# Patient Record
Sex: Male | Born: 2000 | Race: Black or African American | Hispanic: No | Marital: Single | Smoking: Never smoker
Health system: Southern US, Community
[De-identification: ages and names within clinical notes are randomized; demographics above are authoritative.]

---
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
## 2021-05-24 ENCOUNTER — Emergency Department (HOSPITAL_BASED_OUTPATIENT_CLINIC_OR_DEPARTMENT_OTHER): Payer: Medicaid Other

## 2021-05-24 ENCOUNTER — Encounter (HOSPITAL_BASED_OUTPATIENT_CLINIC_OR_DEPARTMENT_OTHER): Payer: Self-pay | Admitting: Emergency Medicine

## 2021-05-24 ENCOUNTER — Emergency Department (HOSPITAL_BASED_OUTPATIENT_CLINIC_OR_DEPARTMENT_OTHER)
Admission: EM | Admit: 2021-05-24 | Discharge: 2021-05-24 | Disposition: A | Discharge status: Home / Self Care | Payer: Medicaid Other | Attending: Emergency Medicine | Admitting: Emergency Medicine

## 2021-05-24 ENCOUNTER — Other Ambulatory Visit: Payer: Self-pay

## 2021-05-24 DIAGNOSIS — M79605 Pain in left leg: Secondary | ICD-10-CM | POA: Insufficient documentation

## 2021-05-24 DIAGNOSIS — R109 Unspecified abdominal pain: Secondary | ICD-10-CM | POA: Insufficient documentation

## 2021-05-24 LAB — URINALYSIS, ROUTINE W REFLEX MICROSCOPIC
Bilirubin Urine: NEGATIVE
Glucose, UA: NEGATIVE mg/dL
Hgb urine dipstick: NEGATIVE
Ketones, ur: NEGATIVE mg/dL
Leukocytes,Ua: NEGATIVE
Nitrite: NEGATIVE
Protein, ur: NEGATIVE mg/dL
Specific Gravity, Urine: 1.01 (ref 1.005–1.030)
pH: 7 (ref 5.0–8.0)

## 2021-05-24 NOTE — ED Provider Notes (Signed)
[facility] EMERGENCY DEPT Provider Note   CSN: [id number] Arrival date & time: 05/24/21  1632     History  Chief Complaint  Patient presents with   Flank Pain    Victor Kaufman is a 21 y.o. male who presents to the ED complaining of left leg pain x3 weeks.  He describes it as a dull ache and is constant.  He notes that his symptoms mildly resolved and then came back.  Denies history of UTI kidney stones.  Has not tried any medications for his symptoms.  Denies fever, chills, nausea, vomiting, abdominal pain, penile pain/swelling, testicular pain/swelling.  Patient says he is not sexually active.   The history is provided by the patient and a parent. No language interpreter was used.      Home Medications Prior to Admission medications   Medication Sig Start Date End Date Taking? Authorizing Provider  benzocaine (BABY ORAJEL) 7.5 % oral gel Use as directed in the mouth or throat 3 (three) times daily as needed for pain. Patient not taking: Reported on 06/29/2020 06/27/17   Ok Edwards, PA-C  chlorhexidine (PERIDEX) 0.12 % solution Use as directed 15 mLs in the mouth or throat 2 (two) times daily. Patient not taking: Reported on 06/29/2020 06/27/17   Ok Edwards, PA-C  meloxicam (MOBIC) 7.5 MG tablet Take 1 tablet (7.5 mg total) by mouth daily. Patient not taking: Reported on 06/29/2020 06/27/17   Ok Edwards, PA-C      Allergies    Patient has no known allergies.    Review of Systems   Review of Systems  Constitutional:  Negative for chills and fever.  Gastrointestinal:  Negative for abdominal pain, nausea and vomiting.  Genitourinary:  Positive for flank pain. Negative for dysuria, hematuria, penile discharge, penile pain, penile swelling, scrotal swelling and testicular pain.  All other systems reviewed and are negative.  Physical Exam Updated Vital Signs BP 124/76 (BP Location: Right Arm)    Pulse (!) 51    Temp 98.8 F (37.1 C) (Oral)    Resp 16    Ht 6' (1.829 m)     Wt 81.6 kg    SpO2 100%    BMI 24.41 kg/m  Physical Exam Vitals and nursing note reviewed.  Constitutional:      General: He is not in acute distress.    Appearance: He is not diaphoretic.  HENT:     Head: Normocephalic and atraumatic.     Mouth/Throat:     Pharynx: No oropharyngeal exudate.  Eyes:     General: No scleral icterus.    Conjunctiva/sclera: Conjunctivae normal.  Cardiovascular:     Rate and Rhythm: Normal rate and regular rhythm.     Pulses: Normal pulses.     Heart sounds: Normal heart sounds.  Pulmonary:     Effort: Pulmonary effort is normal. No respiratory distress.     Breath sounds: Normal breath sounds. No wheezing.  Abdominal:     General: Bowel sounds are normal.     Palpations: Abdomen is soft. There is no mass.     Tenderness: There is no abdominal tenderness. There is left CVA tenderness. There is no right CVA tenderness, guarding or rebound.     Comments: Mild left CVA tenderness to palpation.  No overlying skin changes.  No tenderness to palpation noted to abdomen.  Musculoskeletal:        General: Normal range of motion.     Cervical back: Normal range of motion  and neck supple.     Comments: No edema noted to bilateral lower extremities.  Skin:    General: Skin is warm and dry.  Neurological:     Mental Status: He is alert.  Psychiatric:        Behavior: Behavior normal.    ED Results / Procedures / Treatments   Labs (all labs ordered are listed, but only abnormal results are displayed) Labs Reviewed  URINALYSIS, ROUTINE W REFLEX MICROSCOPIC - Abnormal; Notable for the following components:      Result Value   Color, Urine COLORLESS (*)    All other components within normal limits    EKG None  Radiology CT Renal Stone Study  Result Date: 05/24/2021 CLINICAL DATA:  21 year old male with acute LEFT abdominal and flank pain for 3 weeks. EXAM: CT ABDOMEN AND PELVIS WITHOUT CONTRAST TECHNIQUE: Multidetector CT imaging of the abdomen and  pelvis was performed following the standard protocol without IV contrast. RADIATION DOSE REDUCTION: This exam was performed according to the departmental dose-optimization program which includes automated exposure control, adjustment of the mA and/or kV according to patient size and/or use of iterative reconstruction technique. COMPARISON:  None. FINDINGS: Unenhanced CT was performed per clinician order. Lack of IV contrast limits sensitivity and specificity, especially for evaluation of abdominal/pelvic solid viscera. Lower chest: No acute abnormality. Hepatobiliary: The liver and gallbladder are unremarkable. There is no evidence of intrahepatic or extrahepatic biliary dilatation. Pancreas: Unremarkable Spleen: Unremarkable Adrenals/Urinary Tract: The kidneys, adrenal glands and bladder are unremarkable. There is no evidence of hydronephrosis or urinary calculi. Stomach/Bowel: Stomach is within normal limits. Appendix appears normal. No evidence of bowel wall thickening, distention, or inflammatory changes. Vascular/Lymphatic: No significant vascular findings are present. No enlarged abdominal or pelvic lymph nodes. Reproductive: Prostate is unremarkable. Other: No ascites, focal collection or pneumoperitoneum. Musculoskeletal: No acute or suspicious bony abnormalities are noted. IMPRESSION: No CT evidence of acute or significant abnormality. Electronically Signed   By: Margarette Canada M.D.   On: 05/24/2021 20:34    Procedures Procedures    Medications Ordered in ED Medications - No data to display  ED Course/ Medical Decision Making/ A&P                           Medical Decision Making Amount and/or Complexity of Data Reviewed Labs: ordered. Radiology: ordered.   Patient presents to the ED with a left flank pain x3 weeks.  Denies nausea, vomiting, fever, chills, abdominal pain.  Denies concern for STDs at this time.  Has not tried medications at home.  Denies prior history of UTI or kidney stones.   Vital signs stable, patient afebrile, not tachycardic or hypoxic.  On exam patient with mild left CVA tenderness to palpation.  Otherwise no acute cardiovascular, respiratory, abdominal spine findings.  Differential diagnosis includes acute cystitis, pyelonephritis, nephrolithiasis.   Labs:  I ordered, and personally interpreted labs.  The pertinent results include:  Urinalysis unremarkable without signs of infection.  Imaging: I ordered imaging studies including CT renal stone study I independently visualized and interpreted imaging which showed: No CT evidence of acute or significant abnormality. I agree with the radiologist interpretation   Disposition: Patient presentation suspicious for flank pain without acute findings on CT.  Doubt acute cystitis, urinalysis negative.  Doubt pyelonephritis or nephrolithiasis at this time. After consideration of the diagnostic results and the patients response to treatment, I feel that the patient would benefit from discharge home  with close follow-up with primary care provider. Supportive care measures and strict return precautions discussed with patient at bedside. Pt acknowledges and verbalizes understanding. Pt appears safe for discharge. Follow up as indicated in discharge paperwork.   This chart was dictated using voice recognition software, Dragon. Despite the best efforts of this provider to proofread and correct errors, errors may still occur which can change documentation meaning.  Final Clinical Impression(s) / ED Diagnoses Final diagnoses:  Flank pain    Rx / DC Orders ED Discharge Orders     None         Akon Reinoso A, PA-C 05/24/21 2156    Davonna Belling, MD 05/24/21 [phone]

## 2021-05-24 NOTE — ED Triage Notes (Signed)
Pt endorses left side kidney pain for 3 weeks. Denies n/v.

## 2021-05-24 NOTE — Discharge Instructions (Addendum)
It was a pleasure to take care of you tonight!  Your urinalysis was negative for infection tonight.  Your CT scan did not show any acute abnormalities.  You may follow-up with your primary care provider regarding today's ED visit.  Return to the ED if you are experiencing increasing/worsening flank pain, fever, abdominal pain, worsening symptoms.
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
## 2023-08-20 IMAGING — CT CT RENAL STONE PROTOCOL
2 of 4 series · 17 of 46 positions shown, 19 images · non-contrast
Comparison: None.

CLINICAL DATA: 20-year-old male with acute LEFT abdominal and flank
pain for 3 weeks.



[Series 2: stone full · axial · 0.75mm/px · z∈[-338,+32]mm · 14 of 82 slices shown, 16 images]
[im 4/82  soft-tissue]
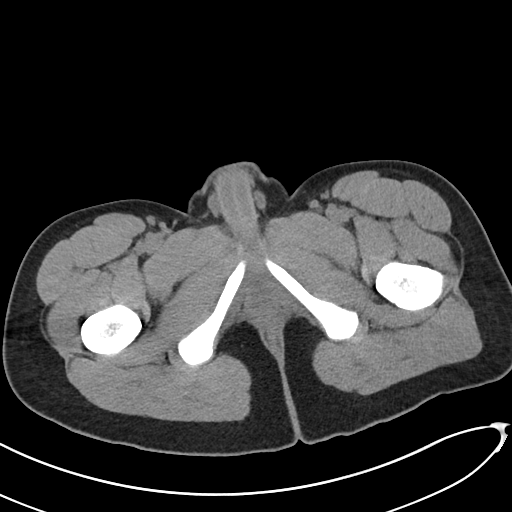
[im 4/82  bone]
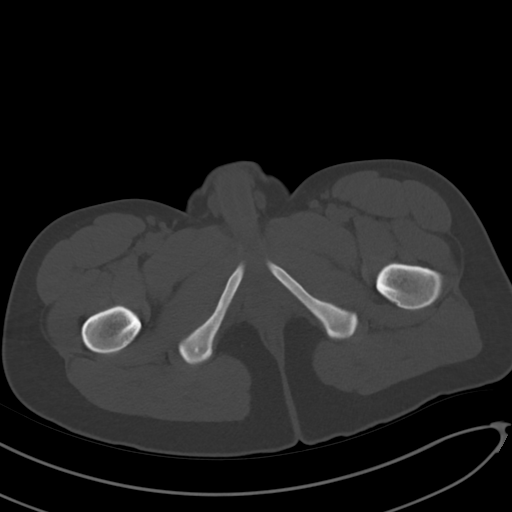
[im 11/82  soft-tissue]
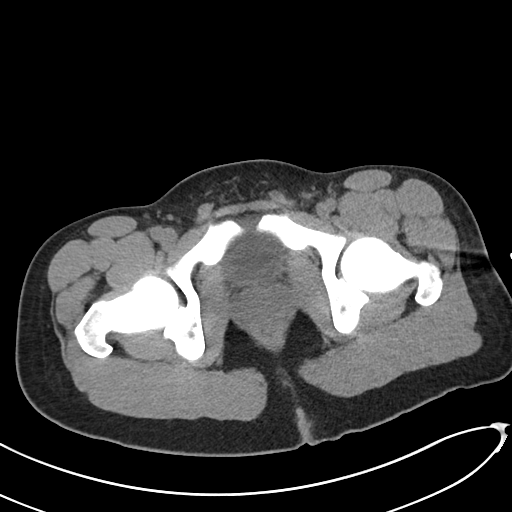
[im 15/82  soft-tissue]
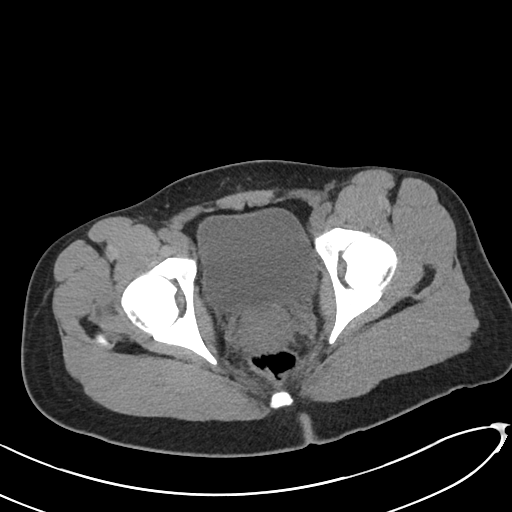
[im 22/82  soft-tissue]
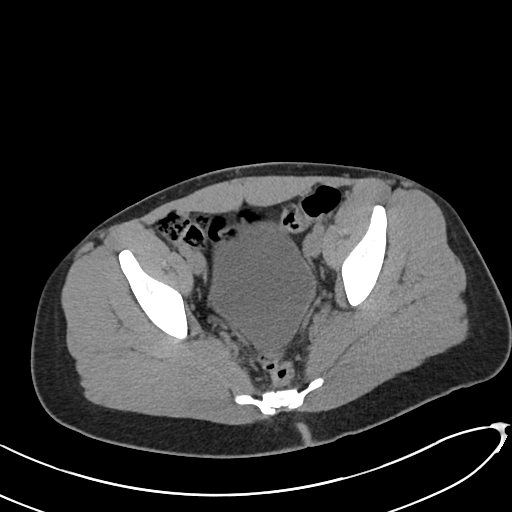
[im 29/82  soft-tissue]
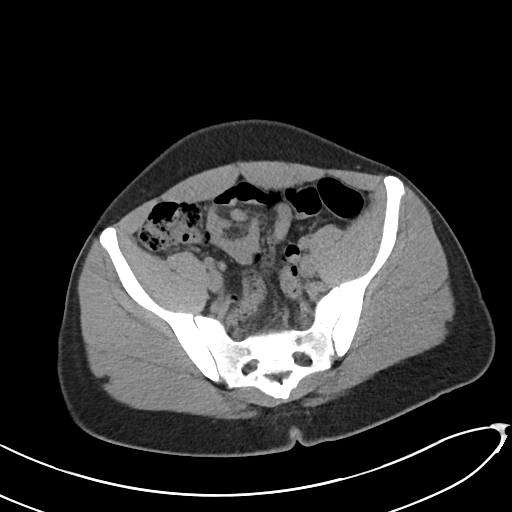
[im 32/82  soft-tissue]
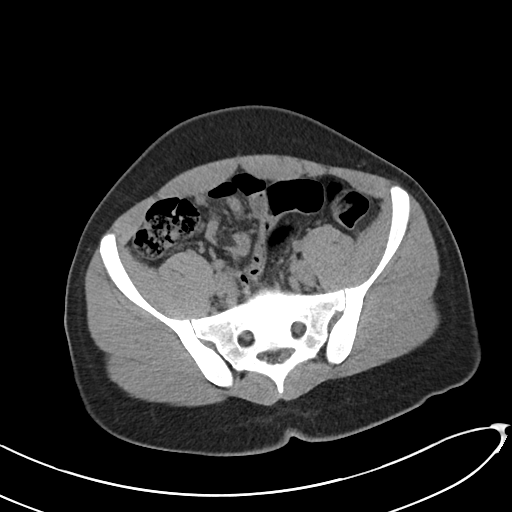
[im 39/82  soft-tissue]
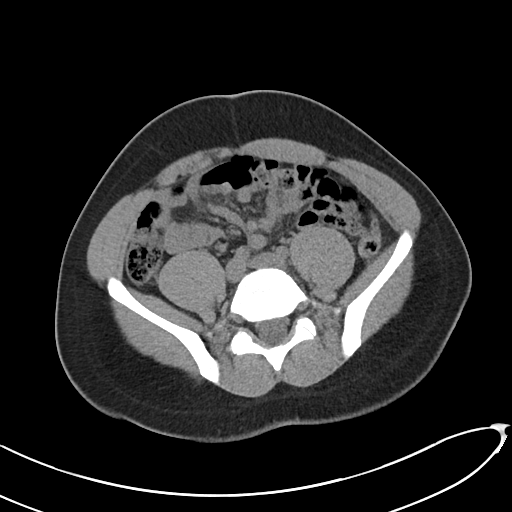
[im 43/82  soft-tissue]
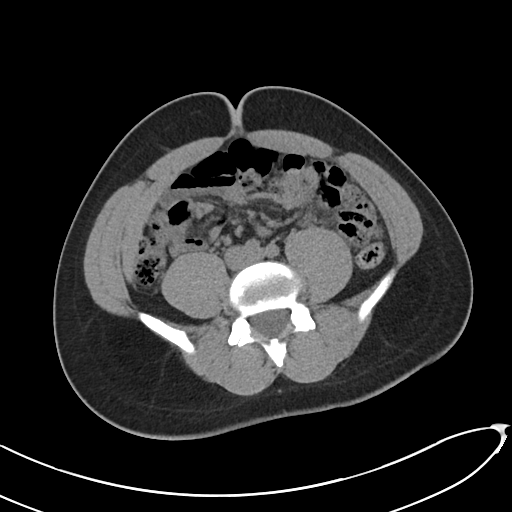
[im 50/82  soft-tissue]
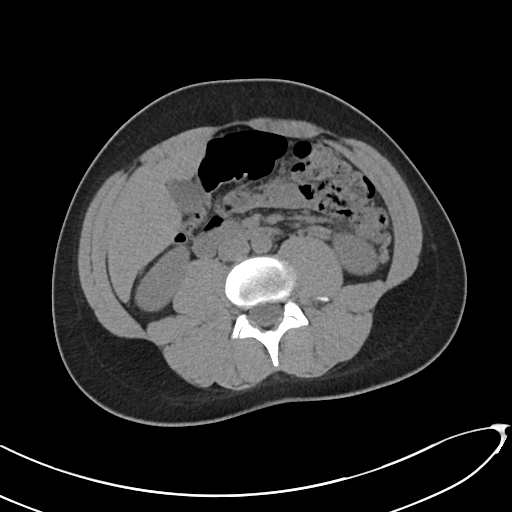
[im 50/82  bone]
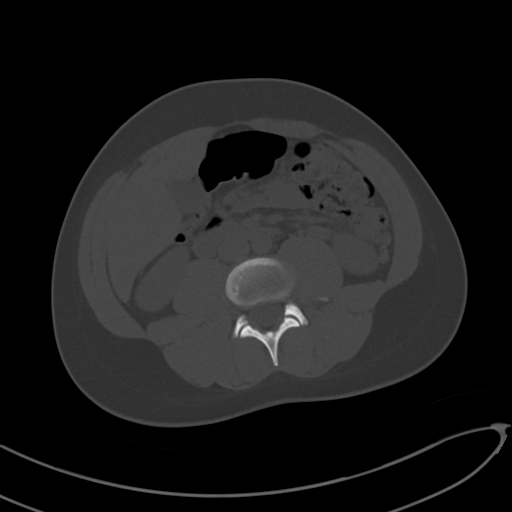
[im 53/82  soft-tissue]
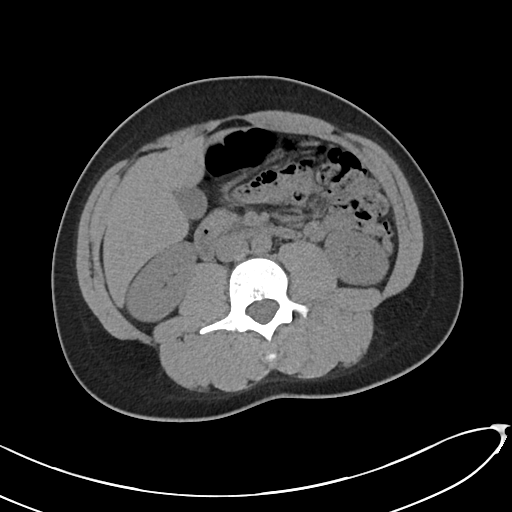
[im 60/82  soft-tissue]
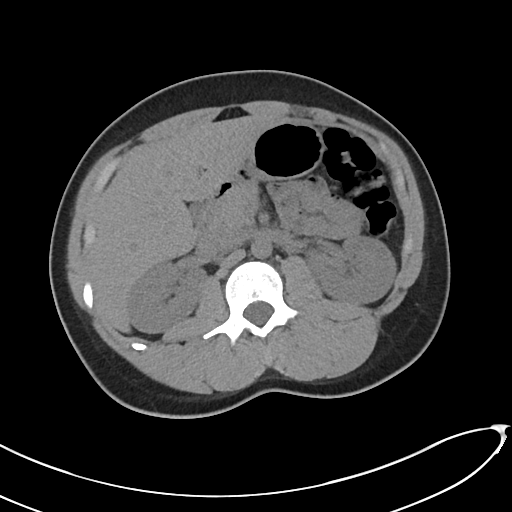
[im 67/82  soft-tissue]
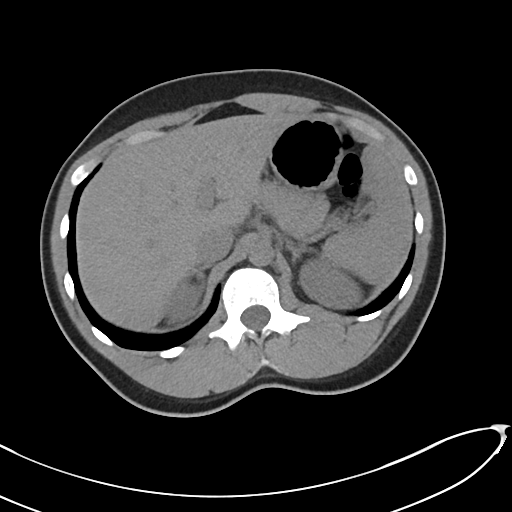
[im 71/82  soft-tissue]
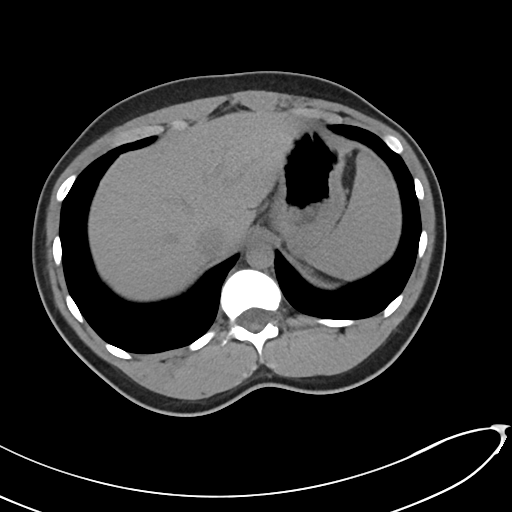
[im 78/82  soft-tissue]
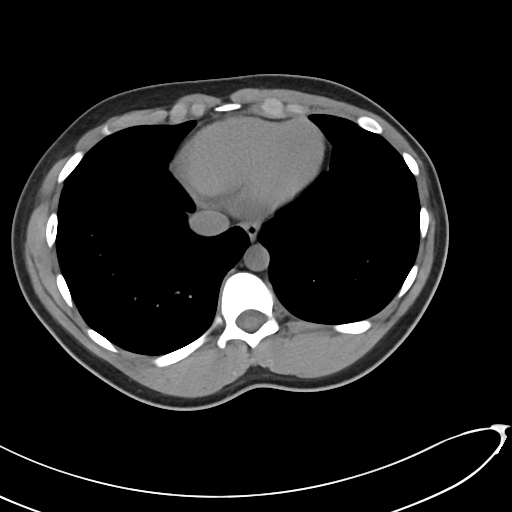

[Series 5: coronal · coronal · 0.78mm/px · 3 of 98 slices shown]
[im 33/98  soft-tissue]
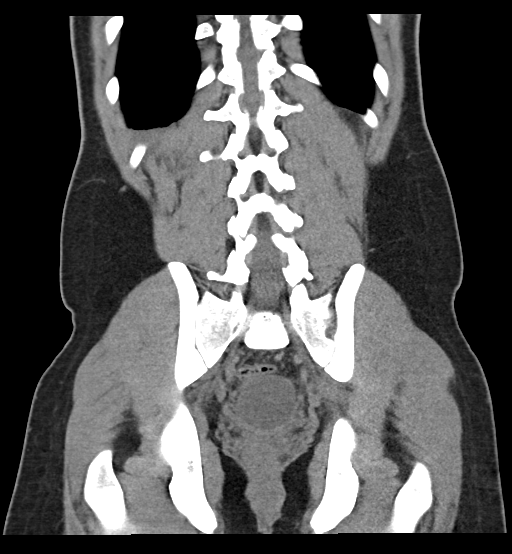
[im 44/98  soft-tissue]
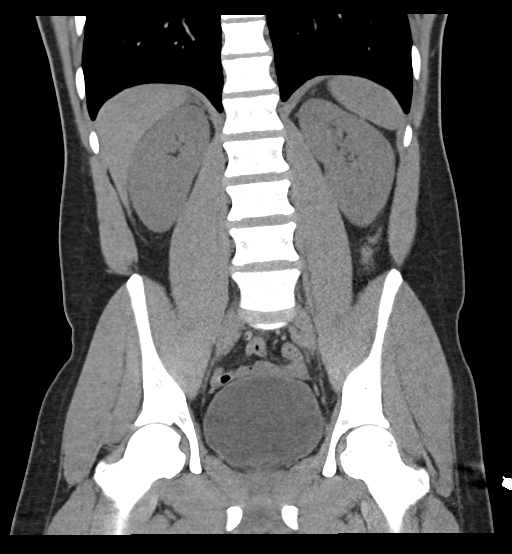
[im 54/98  soft-tissue]
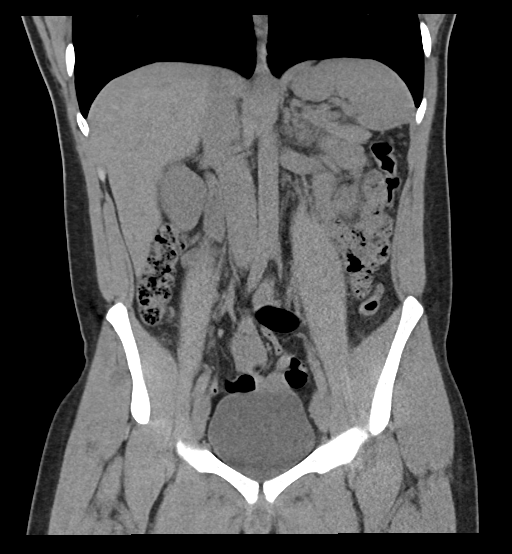

[17 of 46 positions shown; findings below may reference images not displayed]

FINDINGS: Unenhanced CT was performed per clinician order. Lack of IV contrast
limits sensitivity and specificity, especially for evaluation of
abdominal/pelvic solid viscera.

Lower chest: No acute abnormality.

Hepatobiliary: The liver and gallbladder are unremarkable. There is
no evidence of intrahepatic or extrahepatic biliary dilatation.

Pancreas: Unremarkable

Spleen: Unremarkable

Adrenals/Urinary Tract: The kidneys, adrenal glands and bladder are
unremarkable. There is no evidence of hydronephrosis or urinary
calculi.

Stomach/Bowel: Stomach is within normal limits. Appendix appears
normal. No evidence of bowel wall thickening, distention, or
inflammatory changes.

Vascular/Lymphatic: No significant vascular findings are present. No
enlarged abdominal or pelvic lymph nodes.

Reproductive: Prostate is unremarkable.

Other: No ascites, focal collection or pneumoperitoneum.

Musculoskeletal: No acute or suspicious bony abnormalities are
noted.
IMPRESSION: No CT evidence of acute or significant abnormality.
